# Patient Record
Sex: Male | Born: 1985 | Race: White | Hispanic: No | Marital: Single | Smoking: Never smoker
Health system: Southern US, Community
[De-identification: ages and names within clinical notes are randomized; demographics above are authoritative.]

## PROBLEM LIST (undated history)

## (undated) HISTORY — PX: TONSILLECTOMY: SUR1361

---

## 2008-04-28 ENCOUNTER — Emergency Department (HOSPITAL_BASED_OUTPATIENT_CLINIC_OR_DEPARTMENT_OTHER): Admission: EM | Admit: 2008-04-28 | Discharge: 2008-04-28 | Payer: Self-pay | Admitting: Emergency Medicine

## 2014-11-30 ENCOUNTER — Encounter (HOSPITAL_BASED_OUTPATIENT_CLINIC_OR_DEPARTMENT_OTHER): Payer: Self-pay | Admitting: Emergency Medicine

## 2014-11-30 ENCOUNTER — Emergency Department (HOSPITAL_BASED_OUTPATIENT_CLINIC_OR_DEPARTMENT_OTHER)
Admission: EM | Admit: 2014-11-30 | Discharge: 2014-11-30 | Disposition: A | Discharge status: Home / Self Care | Payer: Managed Care, Other (non HMO) | Attending: Emergency Medicine | Admitting: Emergency Medicine

## 2014-11-30 ENCOUNTER — Emergency Department (HOSPITAL_BASED_OUTPATIENT_CLINIC_OR_DEPARTMENT_OTHER): Payer: Managed Care, Other (non HMO)

## 2014-11-30 DIAGNOSIS — Y9289 Other specified places as the place of occurrence of the external cause: Secondary | ICD-10-CM | POA: Insufficient documentation

## 2014-11-30 DIAGNOSIS — Y9389 Activity, other specified: Secondary | ICD-10-CM | POA: Diagnosis not present

## 2014-11-30 DIAGNOSIS — W19XXXA Unspecified fall, initial encounter: Secondary | ICD-10-CM

## 2014-11-30 DIAGNOSIS — Z791 Long term (current) use of non-steroidal anti-inflammatories (NSAID): Secondary | ICD-10-CM | POA: Diagnosis not present

## 2014-11-30 DIAGNOSIS — Y998 Other external cause status: Secondary | ICD-10-CM | POA: Insufficient documentation

## 2014-11-30 DIAGNOSIS — S92352A Displaced fracture of fifth metatarsal bone, left foot, initial encounter for closed fracture: Secondary | ICD-10-CM | POA: Insufficient documentation

## 2014-11-30 DIAGNOSIS — X58XXXA Exposure to other specified factors, initial encounter: Secondary | ICD-10-CM | POA: Diagnosis not present

## 2014-11-30 DIAGNOSIS — S92302A Fracture of unspecified metatarsal bone(s), left foot, initial encounter for closed fracture: Secondary | ICD-10-CM

## 2014-11-30 DIAGNOSIS — S99922A Unspecified injury of left foot, initial encounter: Secondary | ICD-10-CM | POA: Diagnosis present

## 2014-11-30 NOTE — Discharge Instructions (Signed)
Non-weightbearing on your left foot until seen by orthopedics

## 2014-11-30 NOTE — ED Notes (Signed)
Verbal order - Dr. Fayrene FearingJames.

## 2014-11-30 NOTE — ED Provider Notes (Signed)
CSN: [id number]     Arrival date & time 11/30/14  1044 History   First MD Initiated Contact with Patient 11/30/14 1052     Chief Complaint  Patient presents with  . Foot Pain     HPI  Patient percussion evaluation of his left foot and ankle. He was on an obstacle course last Tuesday. He inverted his foot on the top of one of the obstacles. Felt pain. Does walk with a limp. Has extensive bruising to his lateral ankle, now into his forefoot, toes, and plantar aspect of his left foot.  History reviewed. No pertinent past medical history. Past Surgical History  Procedure Laterality Date  . Tonsillectomy     History reviewed. No pertinent family history. History  Substance Use Topics  . Smoking status: Never Smoker   . Smokeless tobacco: Not on file  . Alcohol Use: Yes    Review of Systems  Constitutional: Negative for fever, chills, diaphoresis, appetite change and fatigue.  HENT: Negative for mouth sores, sore throat and trouble swallowing.   Eyes: Negative for visual disturbance.  Respiratory: Negative for cough, chest tightness, shortness of breath and wheezing.   Cardiovascular: Negative for chest pain.  Gastrointestinal: Negative for nausea, vomiting, abdominal pain, diarrhea and abdominal distention.  Endocrine: Negative for polydipsia, polyphagia and polyuria.  Genitourinary: Negative for dysuria, frequency and hematuria.  Musculoskeletal: Negative for gait problem.       Left foot and ankle pain. Soft tissues on bruising to the left foot and ankle.  Skin: Negative for color change, pallor and rash.  Neurological: Negative for dizziness, syncope, light-headedness and headaches.  Hematological: Does not bruise/bleed easily.  Psychiatric/Behavioral: Negative for behavioral problems and confusion.      Allergies  Review of patient's allergies indicates no known allergies.  Home Medications   Prior to Admission medications   Medication Sig Start Date End Date Taking?  Authorizing Provider  naproxen sodium (ANAPROX) 220 MG tablet Take 220 mg by mouth 2 (two) times daily with a meal.   Yes Historical Provider, MD   BP 151/83 mmHg  Pulse 84  Temp(Src) 98.1 F (36.7 C) (Oral)  Resp 16  Ht 6' (1.829 m)  Wt 180 lb (81.647 kg)  BMI 24.41 kg/m2  SpO2 99% Physical Exam  Constitutional: He is oriented to person, place, and time. He appears well-developed and well-nourished. No distress.  HENT:  Head: Normocephalic.  Eyes: Conjunctivae are normal. Pupils are equal, round, and reactive to light. No scleral icterus.  Neck: Normal range of motion. Neck supple. No thyromegaly present.  Cardiovascular: Normal rate and regular rhythm.  Exam reveals no gallop and no friction rub.   No murmur heard. Pulmonary/Chest: Effort normal and breath sounds normal. No respiratory distress. He has no wheezes. He has no rales.  Abdominal: Soft. Bowel sounds are normal. He exhibits no distension. There is no tenderness. There is no rebound.  Musculoskeletal: Normal range of motion.       Feet:  Neurological: He is alert and oriented to person, place, and time.  Skin: Skin is warm and dry. No rash noted.  Psychiatric: He has a normal mood and affect. His behavior is normal.    ED Course  Procedures (including critical care time) Labs Review Labs Reviewed - No data to display  Imaging Review Dg Ankle Complete Left  11/30/2014   CLINICAL DATA:  29 year old male with left foot and ankle pain. Rolled foot last Tuesday while trying out for [employer]  EXAM: LEFT ANKLE COMPLETE - 3+ VIEW  COMPARISON:  Concurrently obtained radiographs of the left foot  FINDINGS: Acute avulsion fracture from the base of the fifth metatarsal. The ankle mortise remains congruent. No evidence of fracture of the distal tibia or fibula. The talar dome is intact. Normal bony mineralization. No lytic or blastic osseous lesion.  IMPRESSION: Avulsion fracture at the base of the fifth metatarsal.    Electronically Signed   By: Malachy MoanHeath  McCullough M.D.   On: 11/30/2014 11:52   Dg Foot Complete Left  11/30/2014   CLINICAL DATA:  Rolled foot with bruising. Injury last Tuesday. Initial encounter.  EXAM: LEFT FOOT - COMPLETE 3+ VIEW  COMPARISON:  None.  FINDINGS: There is an avulsion type fracture through the base of the fifth metatarsal which is moderately distracted but not displaced.  A lucency through the anterior process of the calcaneus appears corticated and is likely developmental/chronic.  Bipartite medial great toe sesamoid.  IMPRESSION: 1. Fifth metatarsal base avulsion fracture. 2. Chronic appearing lucency through the anterior process calcaneus.   Electronically Signed   By: Marnee SpringJonathon  Watts M.D.   On: 11/30/2014 11:52     EKG Interpretation None      MDM   Final diagnoses:  Fall  Metatarsal fracture, left, closed, initial encounter    X-rays reviewed with patient. Outpatient follow-up in orthopedics discussed. Patient placed in a posterior splint. I reexamined her this was applied. Properly positioned. His skin is protected. Plans orthopedic follow-up, nonweightbearing.   Rolland PorterMark Austyn Seier, MD 11/30/14 [phone]

## 2014-11-30 NOTE — ED Notes (Signed)
Patient transported to and from x-ray.

## 2014-11-30 NOTE — ED Notes (Signed)
Patient training in obstacle course last Tuesday evening and tripped rotating left foot. Patient has bruising to top of foot over toes, lateral bruising of foot and ankle.

## 2016-01-17 IMAGING — CR DG ANKLE COMPLETE 3+V*L*
3 series · 3 of 3 positions shown · non-contrast
Comparison: Concurrently obtained radiographs of the left foot

CLINICAL DATA: 28-year-old male with left foot and ankle pain.
Rolled foot last [REDACTED] while trying out for [employer]

EXAM:
LEFT ANKLE COMPLETE - 3+ VIEW

[t ankle joint ap left]
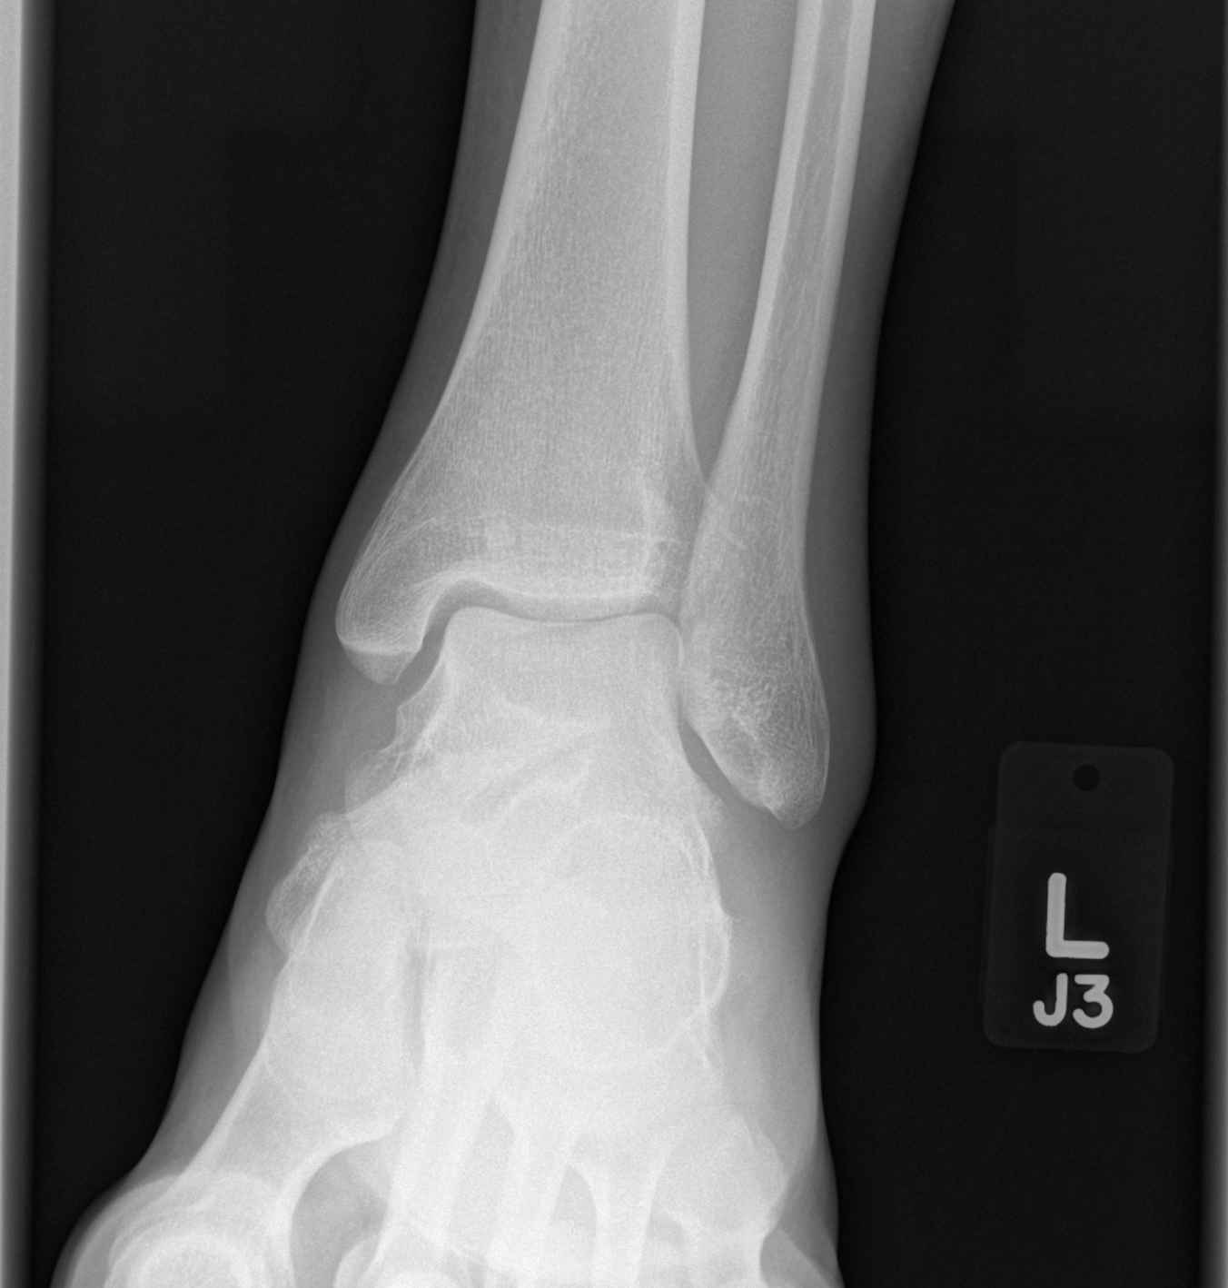

[t ankle joint oblique left]
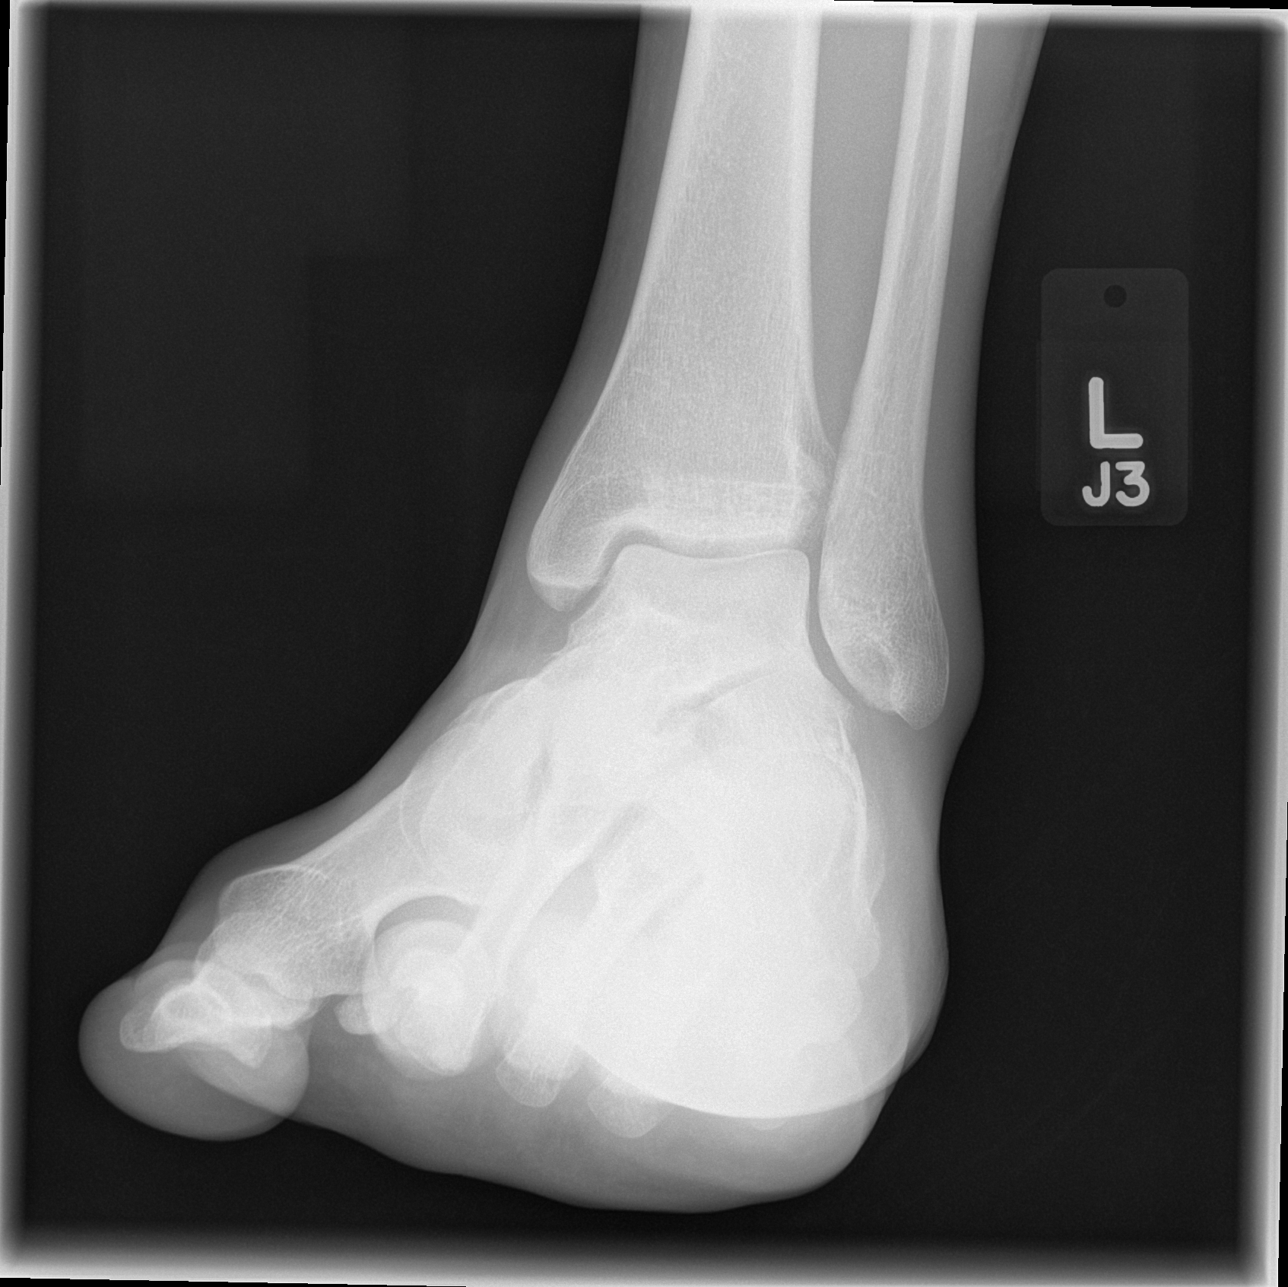

[t ankle joint lat left]
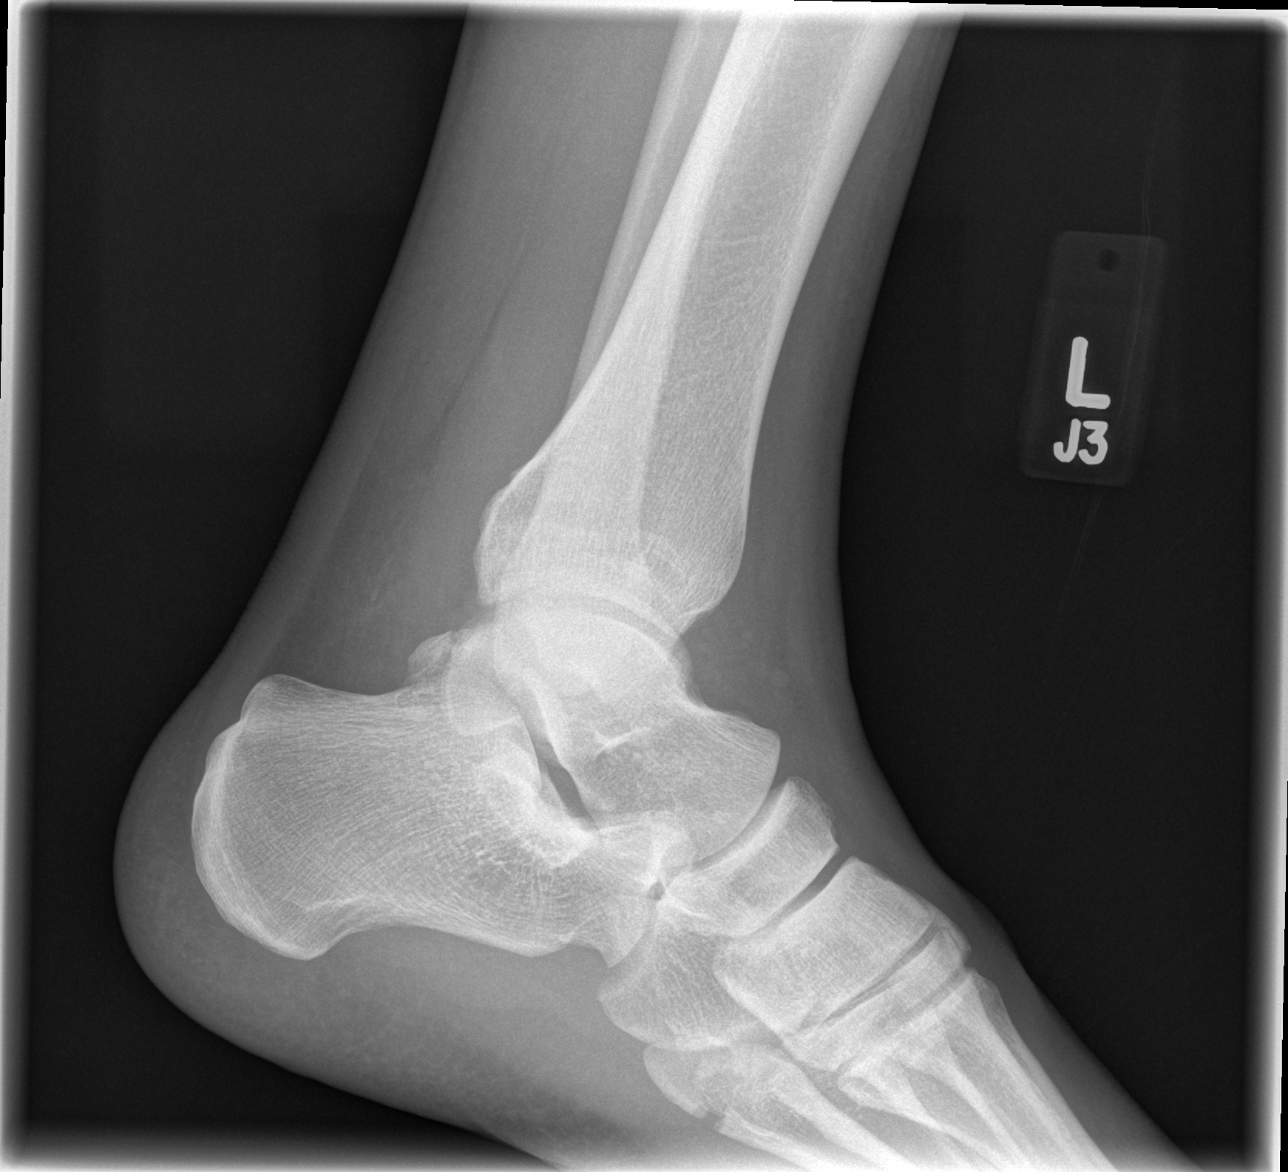

[3 of 3 positions shown; findings below may reference images not displayed]

FINDINGS: Acute avulsion fracture from the base of the fifth metatarsal. The
ankle mortise remains congruent. No evidence of fracture of the
distal tibia or fibula. The talar dome is intact. Normal bony
mineralization. No lytic or blastic osseous lesion.
IMPRESSION: Avulsion fracture at the base of the fifth metatarsal.
